# Patient Record
Sex: Female | Born: 2000 | Race: Black or African American | Hispanic: No | Marital: Single | Smoking: Never smoker
Health system: Southern US, Community
[De-identification: ages and names within clinical notes are randomized; demographics above are authoritative.]

## PROBLEM LIST (undated history)

## (undated) DIAGNOSIS — D649 Anemia, unspecified: Secondary | ICD-10-CM

## (undated) HISTORY — PX: ORTHOPEDIC SURGERY: SHX850

---
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
## 2020-04-04 IMAGING — CR DG PELVIS 1-2V
1 series · 1 of 1 positions shown · non-contrast
Comparison: None.

CLINICAL DATA: Chronic left hip and pelvic pain. Patient states
with due to surgery 3-4 years ago in this region.

EXAM:
PELVIS - 1-2 VIEW

[pelvis ap]
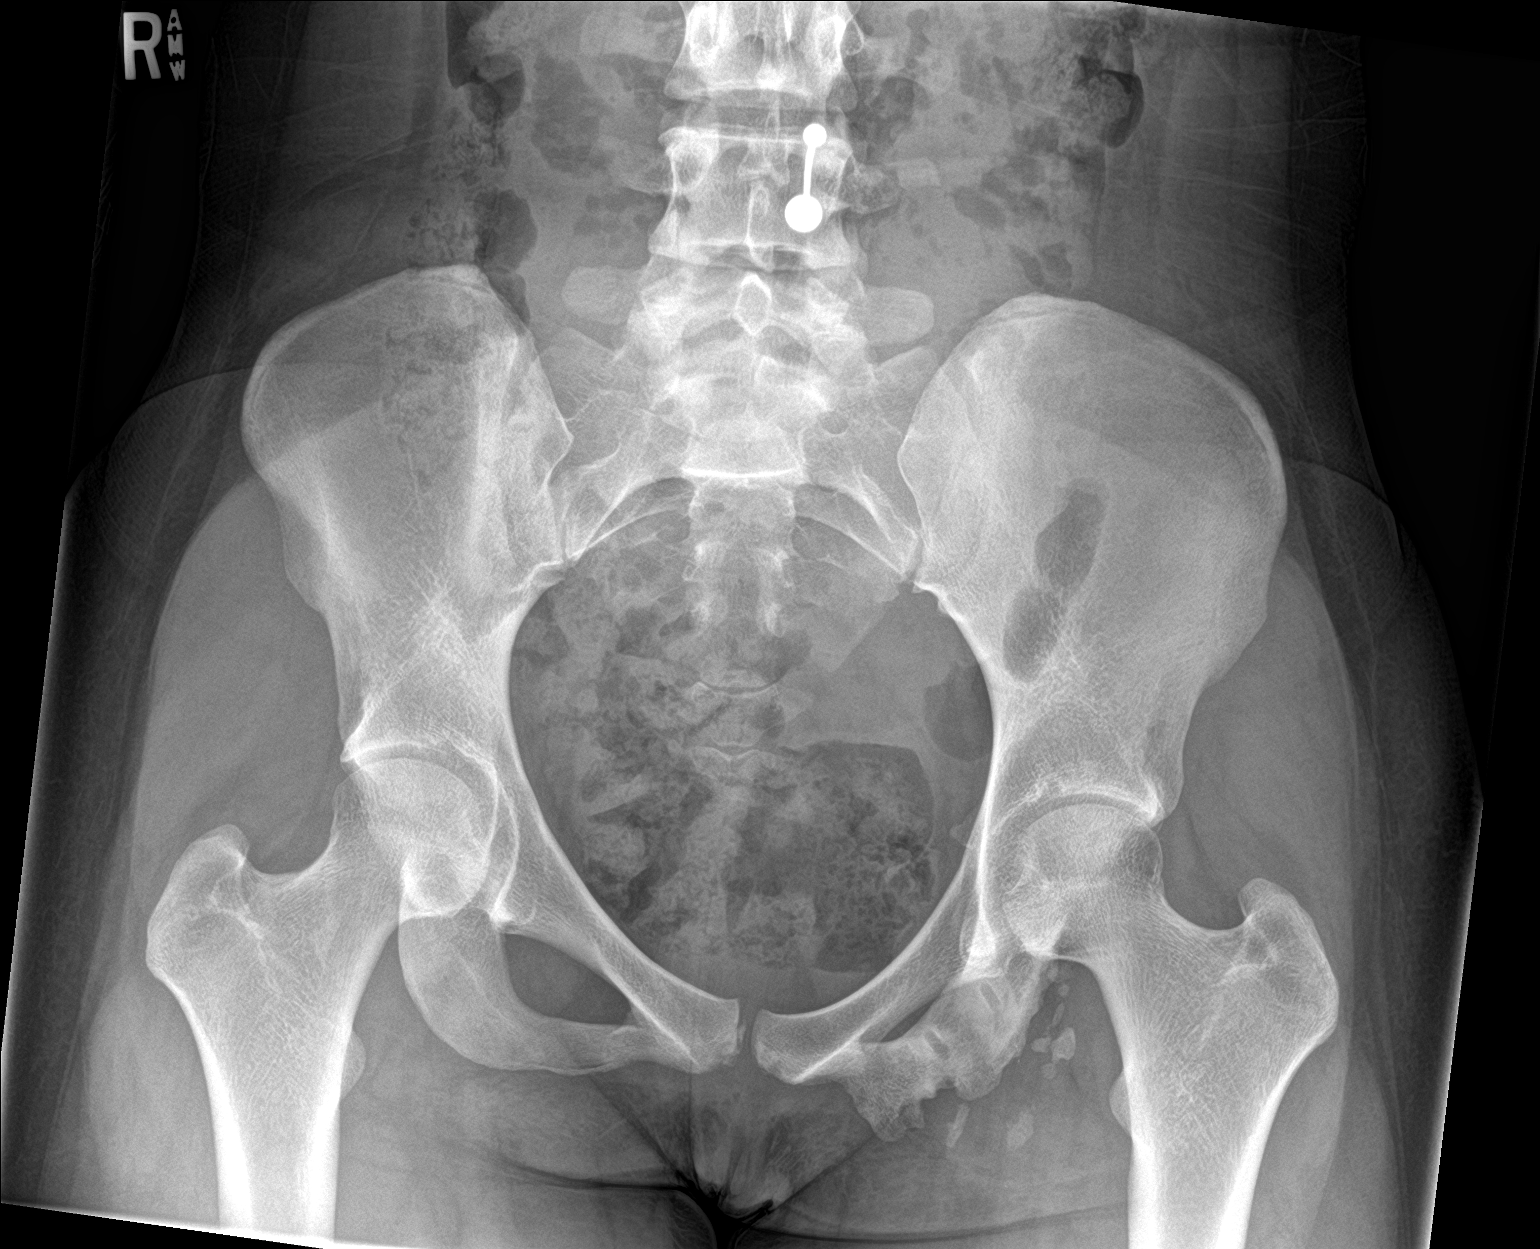

[1 of 1 positions shown; findings below may reference images not displayed]

FINDINGS: Mild symmetric degenerative change of the hips. Findings suggesting
old left inferior pubic ramus and ischial bone fracture with
associated old hardware tracts. No acute fracture or dislocation.
IMPRESSION: No acute findings.

Old left pelvic fracture as described.
